# Patient Record
Sex: Male | Born: 1985 | Race: Black or African American | Hispanic: No | Marital: Single | Smoking: Never smoker
Health system: Southern US, Community
[De-identification: ages and names within clinical notes are randomized; demographics above are authoritative.]

## PROBLEM LIST (undated history)

## (undated) DIAGNOSIS — L739 Follicular disorder, unspecified: Secondary | ICD-10-CM

---

## 2011-11-23 ENCOUNTER — Encounter: Payer: Self-pay | Admitting: Emergency Medicine

## 2011-11-23 DIAGNOSIS — L03211 Cellulitis of face: Secondary | ICD-10-CM | POA: Insufficient documentation

## 2011-11-23 DIAGNOSIS — L0201 Cutaneous abscess of face: Secondary | ICD-10-CM | POA: Insufficient documentation

## 2011-11-23 DIAGNOSIS — L738 Other specified follicular disorders: Secondary | ICD-10-CM | POA: Insufficient documentation

## 2011-11-23 NOTE — ED Notes (Signed)
Pt c/o recurrent abscess to chin and right side

## 2011-11-24 ENCOUNTER — Emergency Department (HOSPITAL_BASED_OUTPATIENT_CLINIC_OR_DEPARTMENT_OTHER)
Admission: EM | Admit: 2011-11-24 | Discharge: 2011-11-24 | Disposition: A | Discharge status: Home / Self Care | Payer: PRIVATE HEALTH INSURANCE | Attending: Emergency Medicine | Admitting: Emergency Medicine

## 2011-11-24 DIAGNOSIS — L739 Follicular disorder, unspecified: Secondary | ICD-10-CM

## 2011-11-24 DIAGNOSIS — L0291 Cutaneous abscess, unspecified: Secondary | ICD-10-CM

## 2011-11-24 MED ORDER — IBUPROFEN 600 MG PO TABS: 600.0000 mg | ORAL_TABLET | Freq: Four times a day (QID) | ORAL | Status: AC | PRN

## 2011-11-24 MED ORDER — HYDROCODONE-ACETAMINOPHEN 5-325 MG PO TABS: 1.0000 | ORAL_TABLET | ORAL | Status: AC | PRN

## 2011-11-24 MED ORDER — LIDOCAINE HCL 2 % IJ SOLN
INTRAMUSCULAR | Status: AC
  Filled 2011-11-24: qty 1

## 2011-11-24 MED ORDER — CLINDAMYCIN HCL 150 MG PO CAPS: 450.0000 mg | ORAL_CAPSULE | Freq: Three times a day (TID) | ORAL | Status: AC

## 2011-11-24 MED ORDER — HYDROCODONE-ACETAMINOPHEN 5-325 MG PO TABS
1.0000 | ORAL_TABLET | Freq: Once | ORAL | Status: AC
  Administered 2011-11-24: 1 via ORAL
  Filled 2011-11-24: qty 1

## 2011-11-24 NOTE — ED Provider Notes (Signed)
History     CSN: [id number]  Arrival date & time 11/23/11  2117   First MD Initiated Contact with Patient 11/24/11 0007      Chief Complaint  Patient presents with  . Abscess    (Consider location/radiation/quality/duration/timing/severity/associated sxs/prior treatment) HPI 25yoM ho multiple abscesses presents with concern for abscess. The patient states that for the past 2 days he's experienced an abscess to his left chin. States that the abscess on his chin began to spontaneously drain today. The abscess on his right side has been there for approximately one to 2 weeks and states that he has recurrent abscesses in this area. He denies fever, chills. He denies shortness of breath, neck swelling, tongue swelling. No other complaints today.  History reviewed. No pertinent past medical history.  History reviewed. No pertinent past surgical history.  No family history on file.  History  Substance Use Topics  . Smoking status: Never Smoker   . Smokeless tobacco: Not on file  . Alcohol Use: No    Review of Systems  All other systems reviewed and are negative.   except as noted HPI   Allergies  Review of patient's allergies indicates not on file.  Home Medications   Current Outpatient Rx  Name Route Sig Dispense Refill  . CLINDAMYCIN HCL 150 MG PO CAPS Oral Take 3 capsules (450 mg total) by mouth 3 (three) times daily. 90 capsule 0  . HYDROCODONE-ACETAMINOPHEN 5-325 MG PO TABS Oral Take 1 tablet by mouth every 4 (four) hours as needed for pain. 6 tablet 0  . IBUPROFEN 600 MG PO TABS Oral Take 1 tablet (600 mg total) by mouth every 6 (six) hours as needed for pain. 30 tablet 0    BP 122/68  Pulse 70  Temp(Src) 98.2 F (36.8 C) (Oral)  Resp 18  Wt 160 lb (72.576 kg)  SpO2 100%  Physical Exam  Nursing note and vitals reviewed. Constitutional: He is oriented to person, place, and time. He appears well-developed and well-nourished. No distress.  HENT:  Head:  Atraumatic.  Mouth/Throat: Oropharynx is clear and moist.  Eyes: Conjunctivae are normal. Pupils are equal, round, and reactive to light.  Neck: Neck supple.  Cardiovascular: Normal rate, regular rhythm, normal heart sounds and intact distal pulses.  Exam reveals no gallop and no friction rub.   No murmur heard. Pulmonary/Chest: Effort normal. No respiratory distress. He has no wheezes. He has no rales.  Abdominal: Soft. Bowel sounds are normal. There is no tenderness. There is no rebound and no guarding.  Musculoskeletal: Normal range of motion. He exhibits no edema and no tenderness.  Neurological: He is alert and oriented to person, place, and time.  Skin: Skin is warm and dry.       Left chin with 3 x 2 cm area of induration with central opening. No active drainage. There is no fluctuance. No overlying erythema  Right hip/right lower quadrant with 1 x 2 cm area of induration with central opening. No active drainage. There is no fluctuance. no overlying erythema  Psychiatric: He has a normal mood and affect.    ED Course  Procedures (including critical care time)  Labs Reviewed - No data to display No results found.   1. Abscess   2. Folliculitis     MDM  The patient has 2 skin lesions. There is a chronic appearing infection of his lower abdominal wall. He also has an abscess of his left hand. This likely started as a  folliculitis. There is induration and spontaneously drained earlier today. There is nothing to drain at this time. Patient likely has MRSA given history of recurrent abscesses. Given prescription for clindamycin, Norco, ibuprofen. He will follow up for recheck in the emergency department or to his primary care doctor in 2 days. Given strict precautions for return to the emergency department and, particularly for the left chin abscess.        Forbes Cellar, MD 11/24/11 [phone]

## 2011-12-07 ENCOUNTER — Encounter (HOSPITAL_COMMUNITY): Payer: Self-pay

## 2011-12-07 ENCOUNTER — Emergency Department (INDEPENDENT_AMBULATORY_CARE_PROVIDER_SITE_OTHER)
Admission: EM | Admit: 2011-12-07 | Discharge: 2011-12-07 | Disposition: A | Discharge status: Home / Self Care | Payer: PRIVATE HEALTH INSURANCE | Source: Home / Self Care | Attending: Emergency Medicine | Admitting: Emergency Medicine

## 2011-12-07 DIAGNOSIS — J111 Influenza due to unidentified influenza virus with other respiratory manifestations: Secondary | ICD-10-CM

## 2011-12-07 HISTORY — DX: Follicular disorder, unspecified: L73.9

## 2011-12-07 MED ORDER — OSELTAMIVIR PHOSPHATE 75 MG PO CAPS: 75.0000 mg | ORAL_CAPSULE | Freq: Two times a day (BID) | ORAL | Status: AC

## 2011-12-07 MED ORDER — ACETAMINOPHEN 325 MG PO TABS
975.0000 mg | ORAL_TABLET | Freq: Once | ORAL | Status: AC
  Administered 2011-12-07: 975 mg via ORAL

## 2011-12-07 MED ORDER — ACETAMINOPHEN 325 MG PO TABS
ORAL_TABLET | ORAL | Status: AC
  Filled 2011-12-07: qty 3

## 2011-12-07 MED ORDER — IBUPROFEN 600 MG PO TABS: 600.0000 mg | ORAL_TABLET | Freq: Four times a day (QID) | ORAL | Status: DC | PRN

## 2011-12-07 NOTE — ED Notes (Signed)
C/o fever, body aches, cough and runny nose since yesterday.  States he vomited x 1 yesterday.

## 2011-12-07 NOTE — ED Provider Notes (Signed)
History     CSN: [id number]  Arrival date & time 12/07/11  1356   First MD Initiated Contact with Patient 12/07/11 1444      Chief Complaint  Patient presents with  . Fever  . Generalized Body Aches    HPI Comments: HPI : Flu symptoms for about 1 day. Fever to 103 with chills, sweats, myalgias, fatigue, headache. Symptoms are progressively worsening, despite trying OTC fever reducing medicine and rest and fluids. Has decreased appetite, but tolerating some liquids by mouth. Vomited 1x yesterday, none since. Did not get flu shot this year. Close contact with less severe flu like sx. Currently on clindamycin for folliculitis.   Review of Systems: Positive for fatigue, mild nasal congestion, mild sore throat, mild swollen anterior neck glands, mild cough. Negative for acute vision changes, stiff neck, focal weakness, syncope, seizures, respiratory distress, diarrhea, GU symptoms.   Patient is a 26 y.o. male presenting with fever. The history is provided by the patient.  Fever Primary symptoms of the febrile illness include fever.    Past Medical History  Diagnosis Date  . Folliculitis     History reviewed. No pertinent past surgical history.  History reviewed. No pertinent family history.  History  Substance Use Topics  . Smoking status: Never Smoker   . Smokeless tobacco: Not on file  . Alcohol Use: No      Review of Systems  Constitutional: Positive for fever.    Allergies  Review of patient's allergies indicates no known allergies.  Home Medications   Current Outpatient Rx  Name Route Sig Dispense Refill  . CLINDAMYCIN HCL 150 MG PO CAPS Oral Take 150 mg by mouth 3 (three) times daily.    Marland Kitchen HYDROCODONE-ACETAMINOPHEN 5-325 MG PO TABS Oral Take 1 tablet by mouth every 6 (six) hours as needed.    Marland Kitchen PRESCRIPTION MEDICATION  Antibiotic for abscess    . IBUPROFEN 600 MG PO TABS Oral Take 1 tablet (600 mg total) by mouth every 6 (six) hours as needed for pain or  fever. 30 tablet 0  . OSELTAMIVIR PHOSPHATE 75 MG PO CAPS Oral Take 1 capsule (75 mg total) by mouth 2 (two) times daily. X 5 days 10 capsule 0    BP 107/62  Pulse 137  Temp(Src) 102.3 F (39.1 C) (Oral)  Resp 20  SpO2 97%  Physical Exam  Nursing note and vitals reviewed. Constitutional: He is oriented to person, place, and time. He appears well-developed and well-nourished.  HENT:  Head: Normocephalic and atraumatic.  Right Ear: Hearing, tympanic membrane and ear canal normal.  Left Ear: Hearing, tympanic membrane and ear canal normal.  Nose: Mucosal edema and rhinorrhea present. No epistaxis.  Mouth/Throat: Uvula is midline and mucous membranes are normal. Posterior oropharyngeal erythema present. No oropharyngeal exudate.       No sinus tenderness  Eyes: Conjunctivae and EOM are normal. Pupils are equal, round, and reactive to light.  Neck: Normal range of motion. Neck supple.       Shotty cervical LN bilaterally  Cardiovascular: Normal rate, regular rhythm and normal heart sounds.   Pulmonary/Chest: Effort normal and breath sounds normal. No respiratory distress.  Abdominal: Soft. Bowel sounds are normal. He exhibits no distension. There is no tenderness.  Musculoskeletal: Normal range of motion. He exhibits no edema and no tenderness.  Neurological: He is alert and oriented to person, place, and time.  Skin: Skin is warm and dry. No rash noted.  Psychiatric: He has a normal mood  and affect. His behavior is normal. Judgment and thought content normal.    ED Course  Procedures (including critical care time)  Labs Reviewed - No data to display No results found.   1. Influenza       MDM  Pt appears to be in NAD. VSS. Pt non-toxic appearing. Febrile, tachycardic but appears well hydrated. Given 1 gm tylenol for fever which pt tolerated. No evidence of pharyngitis or OM. No evidence of neck stiffness or other sx to support meningitis. No evidence of dehydration. Abd  S/NT/ND without peritoneal sx. Doubt intraabdominal process. No evidence of PNA or UTI. Pt able to tolerate PO. Pt with flu. Will treat symptomatically and have pt f/u with PCP PRN. Sx <24 hr will rx for tamiflu    Luiz Blare, MD 12/07/11 1600

## 2014-05-02 ENCOUNTER — Ambulatory Visit: Payer: 59

## 2014-05-02 ENCOUNTER — Ambulatory Visit (INDEPENDENT_AMBULATORY_CARE_PROVIDER_SITE_OTHER): Payer: 59 | Admitting: Family Medicine

## 2014-05-02 VITALS — BP 110/80 | HR 68 | Temp 97.9°F | Resp 16 | Ht 68.5 in | Wt 185.0 lb

## 2014-05-02 DIAGNOSIS — R062 Wheezing: Secondary | ICD-10-CM

## 2014-05-02 DIAGNOSIS — R059 Cough, unspecified: Secondary | ICD-10-CM

## 2014-05-02 DIAGNOSIS — R5383 Other fatigue: Secondary | ICD-10-CM

## 2014-05-02 DIAGNOSIS — R05 Cough: Secondary | ICD-10-CM

## 2014-05-02 DIAGNOSIS — R531 Weakness: Secondary | ICD-10-CM

## 2014-05-02 DIAGNOSIS — R5381 Other malaise: Secondary | ICD-10-CM

## 2014-05-02 DIAGNOSIS — R51 Headache: Secondary | ICD-10-CM

## 2014-05-02 LAB — POCT CBC
Granulocyte percent: 50.7 %G (ref 37–80)
HCT, POC: 42.7 % — AB (ref 43.5–53.7)
Hemoglobin: 13.4 g/dL — AB (ref 14.1–18.1)
Lymph, poc: 2.4 (ref 0.6–3.4)
MCH, POC: 25.6 pg — AB (ref 27–31.2)
MCHC: 31.4 g/dL — AB (ref 31.8–35.4)
MCV: 81.4 fL (ref 80–97)
MID (cbc): 0.7 (ref 0–0.9)
MPV: 11.1 fL (ref 0–99.8)
POC Granulocyte: 3.1 (ref 2–6.9)
POC LYMPH PERCENT: 38.2 %L (ref 10–50)
POC MID %: 11.1 %M (ref 0–12)
Platelet Count, POC: 272 10*3/uL (ref 142–424)
RBC: 5.24 M/uL (ref 4.69–6.13)
RDW, POC: 14.3 %
WBC: 6.2 10*3/uL (ref 4.6–10.2)

## 2014-05-02 MED ORDER — AMOXICILLIN 875 MG PO TABS: 875.0000 mg | ORAL_TABLET | Freq: Two times a day (BID) | ORAL | Status: DC

## 2014-05-02 MED ORDER — HYDROCODONE-HOMATROPINE 5-1.5 MG/5ML PO SYRP: 5.0000 mL | ORAL_SOLUTION | Freq: Three times a day (TID) | ORAL | Status: DC | PRN

## 2014-05-02 NOTE — Addendum Note (Signed)
Addended by: Johnnette Litter on: 05/02/2014 06:05 PM   Modules accepted: Orders

## 2014-05-02 NOTE — Progress Notes (Addendum)
Is a 28 year old accompanied by his significant other who comes in with 1 week of cough, headache, and fatigue. He's had some past medical history consistent with asthma but has not been diagnosed with asthma. She's had a fever in the last couple days as well.  He's had no hemoptysis or chest pain. He's also had no shortness of breath.  Objective: No acute distress HEENT: Unremarkable ears and throat Neck: Supple no adenopathy Heart: Regular no matter, no murmur Chest: Few expiratory wheezes, primarily in the left chest Skin: Unremarkable Extremities: Moving easily with no obvious muscle wasting or impairment Results for orders placed in visit on 05/02/14  POCT CBC      Result Value Ref Range   WBC 6.2  4.6 - 10.2 K/uL   Lymph, poc 2.4  0.6 - 3.4   POC LYMPH PERCENT 38.2  10 - 50 %L   MID (cbc) 0.7  0 - 0.9   POC MID % 11.1  0 - 12 %M   POC Granulocyte 3.1  2 - 6.9   Granulocyte percent 50.7  37 - 80 %G   RBC 5.24  4.69 - 6.13 M/uL   Hemoglobin 13.4 (*) 14.1 - 18.1 g/dL   HCT, POC 87.6 (*) 81.1 - 53.7 %   MCV 81.4  80 - 97 fL   MCH, POC 25.6 (*) 27 - 31.2 pg   MCHC 31.4 (*) 31.8 - 35.4 g/dL   RDW, POC 57.2     Platelet Count, POC 272  142 - 424 K/uL   MPV 11.1  0 - 99.8 fL   [facility] reading (PRIMARY) by  Dr. Milus Glazier: CXR. Multiple small pulmonary nodules and some peribronchial cuffing.  Cough - Plan: DG Chest 2 View, POCT CBC, HYDROcodone-homatropine (HYCODAN) 5-1.5 MG/5ML syrup, amoxicillin (AMOXIL) 875 MG tablet, DISCONTINUED: amoxicillin (AMOXIL) 875 MG tablet  Weakness - Plan: POCT CBC  Headache(784.0) - Plan: POCT CBC  Wheezing - Plan: DG Chest 2 View, HYDROcodone-homatropine (HYCODAN) 5-1.5 MG/5ML syrup, amoxicillin (AMOXIL) 875 MG tablet, DISCONTINUED: amoxicillin (AMOXIL) 875 MG tablet  Signed, Elvina Sidle, MD

## 2014-05-02 NOTE — Patient Instructions (Signed)

## 2015-04-18 ENCOUNTER — Ambulatory Visit: Payer: Worker's Compensation

## 2015-04-18 ENCOUNTER — Ambulatory Visit (INDEPENDENT_AMBULATORY_CARE_PROVIDER_SITE_OTHER): Payer: Worker's Compensation | Admitting: Family Medicine

## 2015-04-18 VITALS — BP 112/68 | HR 63 | Temp 98.6°F | Resp 16 | Ht 67.5 in | Wt 166.4 lb

## 2015-04-18 DIAGNOSIS — M25532 Pain in left wrist: Secondary | ICD-10-CM | POA: Diagnosis not present

## 2015-04-18 DIAGNOSIS — S6992XA Unspecified injury of left wrist, hand and finger(s), initial encounter: Secondary | ICD-10-CM | POA: Diagnosis not present

## 2015-04-18 MED ORDER — NAPROXEN 500 MG PO TABS: 500.0000 mg | ORAL_TABLET | Freq: Two times a day (BID) | ORAL | Status: AC

## 2015-04-18 NOTE — Patient Instructions (Signed)
Wear wrist splint  Naproxen 500 mg twice daily  Apply ice for about 15 minutes for 5 times daily for the next couple of days  Avoid strenuous work

## 2015-04-18 NOTE — Progress Notes (Signed)
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 He was lifting a case of [employer] off of the shelf, and something shifted underneath it. When it fell he tried to catch it and injured his left wrist. That was earlier this afternoon, and it is continued to hurt him. He does not have any history of prior problems with that wrist.  Objective: Tender in the joint space of the left wrist. The bones do not seem that tender. Flexion extension and lateral flexion and extension of the wrist hurt. He has a good grip and normal sensation. Vascular intact.  Assessment: Left wrist pain and strain secondary to injury  Plan: X-ray left wrist  [facility] reading (PRIMARY) by  Dr. Alwyn RenHopper Normal wrist x-ray  Treat with splint and naproxen. Return in 4 days for recheck.

## 2015-04-22 ENCOUNTER — Ambulatory Visit (INDEPENDENT_AMBULATORY_CARE_PROVIDER_SITE_OTHER): Payer: Worker's Compensation | Admitting: Family Medicine

## 2015-04-22 VITALS — BP 112/80 | HR 73 | Temp 99.3°F | Resp 16 | Ht 67.5 in | Wt 172.2 lb

## 2015-04-22 DIAGNOSIS — S63502D Unspecified sprain of left wrist, subsequent encounter: Secondary | ICD-10-CM

## 2015-04-22 DIAGNOSIS — M25532 Pain in left wrist: Secondary | ICD-10-CM

## 2015-04-22 NOTE — Progress Notes (Signed)
  Subjective:  Patient ID: Michael Flores, male    DOB: 1986-07-11  Age: 29 y.o. MRN: [id number]  Patient is here for follow-up of the wrist injury. It still hurts when he flexes and extends the wrist, but is feeling much better. He feels like he can do his job fine as long as he wears the brace. Does not brace is on.   Objective:   No swelling or deformity. He is tender on flexion extension of the wrist, not on side to side tilt. Finger strength is good and station normal  Assessment & Plan:   Assessment: Strain and pain left wrist  Plan: Patient Instructions  Wear splint  Return in one week    Evalynne Locurto, MD 04/22/2015

## 2015-04-22 NOTE — Patient Instructions (Signed)
Wear splint  Return in one week

## 2015-04-29 ENCOUNTER — Ambulatory Visit (INDEPENDENT_AMBULATORY_CARE_PROVIDER_SITE_OTHER): Payer: Worker's Compensation | Admitting: Family Medicine

## 2015-04-29 ENCOUNTER — Ambulatory Visit: Payer: Worker's Compensation

## 2015-04-29 VITALS — BP 110/80 | HR 86 | Temp 98.8°F | Resp 18 | Ht 67.5 in | Wt 167.0 lb

## 2015-04-29 DIAGNOSIS — M25532 Pain in left wrist: Secondary | ICD-10-CM | POA: Diagnosis not present

## 2015-04-29 NOTE — Patient Instructions (Addendum)
Return in 2 weeks for a follow-up check (I will be here the 18th and 20th)  Continue using the splint  Referral is being made to hand physical therapy for therapy on the wrist to strengthen it

## 2015-04-29 NOTE — Progress Notes (Addendum)
  Subjective:  Patient ID: Michael Flores, male    DOB: [birth year]  Age: 29 y.o. MRN: [id number]  Continues to have a lot of pain in the left wrist with flexion or extension. As long as he keeps the splint on he does okay and has been able to work with it on.   Objective:   Pain in wrist with flexion and extension, not side to side tilt. It is tender right in the joint space area.  Due to the persistence of the pain will go ahead and get a repeat x-ray.  [facility] reading (PRIMARY) by  Dr. Alwyn RenHopper Normal wrist  Assessment & Plan:   Assessment: Pain and sprain left wrist, persistent  Plan: Patient Instructions  Return in 2 weeks for a follow-up check (I will be here the 18th and 20th)  Continue using the splint  Referral is being made to hand physical therapy for therapy on the wrist to strengthen it     Shalah Estelle, MD 04/29/2015

## 2015-05-09 ENCOUNTER — Telehealth: Payer: Self-pay

## 2015-05-09 NOTE — Telephone Encounter (Signed)
Please write Rx for occupational therapy. Fax to [facility] at [phone]

## 2015-05-10 ENCOUNTER — Other Ambulatory Visit: Payer: Self-pay | Admitting: Physician Assistant

## 2015-05-10 DIAGNOSIS — M25532 Pain in left wrist: Secondary | ICD-10-CM

## 2015-05-10 DIAGNOSIS — S6992XA Unspecified injury of left wrist, hand and finger(s), initial encounter: Secondary | ICD-10-CM

## 2015-05-10 NOTE — Telephone Encounter (Signed)
Dr. Alwyn Ren put in referral to PT on 04/29/15 -- who is requesting referral to OT instead?

## 2015-05-10 NOTE — Progress Notes (Signed)
[employer] approved for 12 OT visits.  He is currently at the occupational appointment, and this is sent urgently to begin therapeutic treatment.

## 2015-05-11 NOTE — Telephone Encounter (Signed)
OT referral placed for change.  Faxed sent to OT (PT) facility for his appointment (he was waiting at the facility).  Per my referral note,  He has the occupational therapist. This is a worker's comp, orders only. No need for initial scheduling.  -I will close this now

## 2015-05-11 NOTE — Telephone Encounter (Signed)
Michael Flores states it is for her wrist but it may be for PT. I just thought I heard her say occupational.

## 2015-07-29 ENCOUNTER — Ambulatory Visit (INDEPENDENT_AMBULATORY_CARE_PROVIDER_SITE_OTHER): Payer: Worker's Compensation | Admitting: Emergency Medicine

## 2015-07-29 VITALS — BP 108/78 | HR 55 | Temp 98.1°F | Resp 16 | Ht 67.5 in | Wt 162.0 lb

## 2015-07-29 DIAGNOSIS — M25532 Pain in left wrist: Secondary | ICD-10-CM

## 2015-07-29 NOTE — Progress Notes (Signed)
   Subjective:  Patient ID: Michael Flores, male    DOB: October 24, 1986  Age: 29 y.o. MRN: [id number]  CC: Follow-up   HPI Hazim Treadway presents  for release to work. He was injured on May 23 at work and suffered wrist injury he is returned to work full time and  administratively needs a return to work authorization. He currently has no pain in his wrist. Works full-time with no consequence has no limitation movement or use of his wrist takes no medication  History  Past medical family and social history unremarkable  Review of Systems   Review of systems noncontributory  Objective:  BP 108/78 mmHg  Pulse 55  Temp(Src) 98.1 F (36.7 C) (Oral)  Resp 16  Ht 5' 7.5" (1.715 m)  Wt 162 lb (73.483 kg)  BMI 24.98 kg/m2  SpO2 98%  Physical Exam  Constitutional: He is oriented to person, place, and time. He appears well-developed and well-nourished.  HENT:  Head: Normocephalic and atraumatic.  Eyes: Conjunctivae are normal. Pupils are equal, round, and reactive to light.  Pulmonary/Chest: Effort normal.  Musculoskeletal: He exhibits no edema.  Neurological: He is alert and oriented to person, place, and time.  Skin: Skin is dry.  Psychiatric: He has a normal mood and affect. His behavior is normal. Thought content normal.      Assessment & Plan:   Johnedward was seen today for follow-up.  Diagnoses and all orders for this visit:  Wrist pain, left   I am having Mr. Lorenzen maintain his naproxen.  No orders of the defined types were placed in this encounter.    He was released to go back to work full time with no follow-up  Appropriate red flag conditions were discussed with the patient as well as actions that should be taken.  Patient expressed his understanding.  Follow-up: Return if symptoms worsen or fail to improve.  Carmelina Dane, MD

## 2015-07-29 NOTE — Patient Instructions (Signed)

## 2015-12-03 IMAGING — CR DG WRIST COMPLETE 3+V*L*
4 series · 4 of 4 positions shown · non-contrast
Comparison: None.

CLINICAL DATA: Acute left wrist pain after injury at work today.
Initial encounter.

EXAM:
LEFT WRIST - COMPLETE 3+ VIEW

[PA]
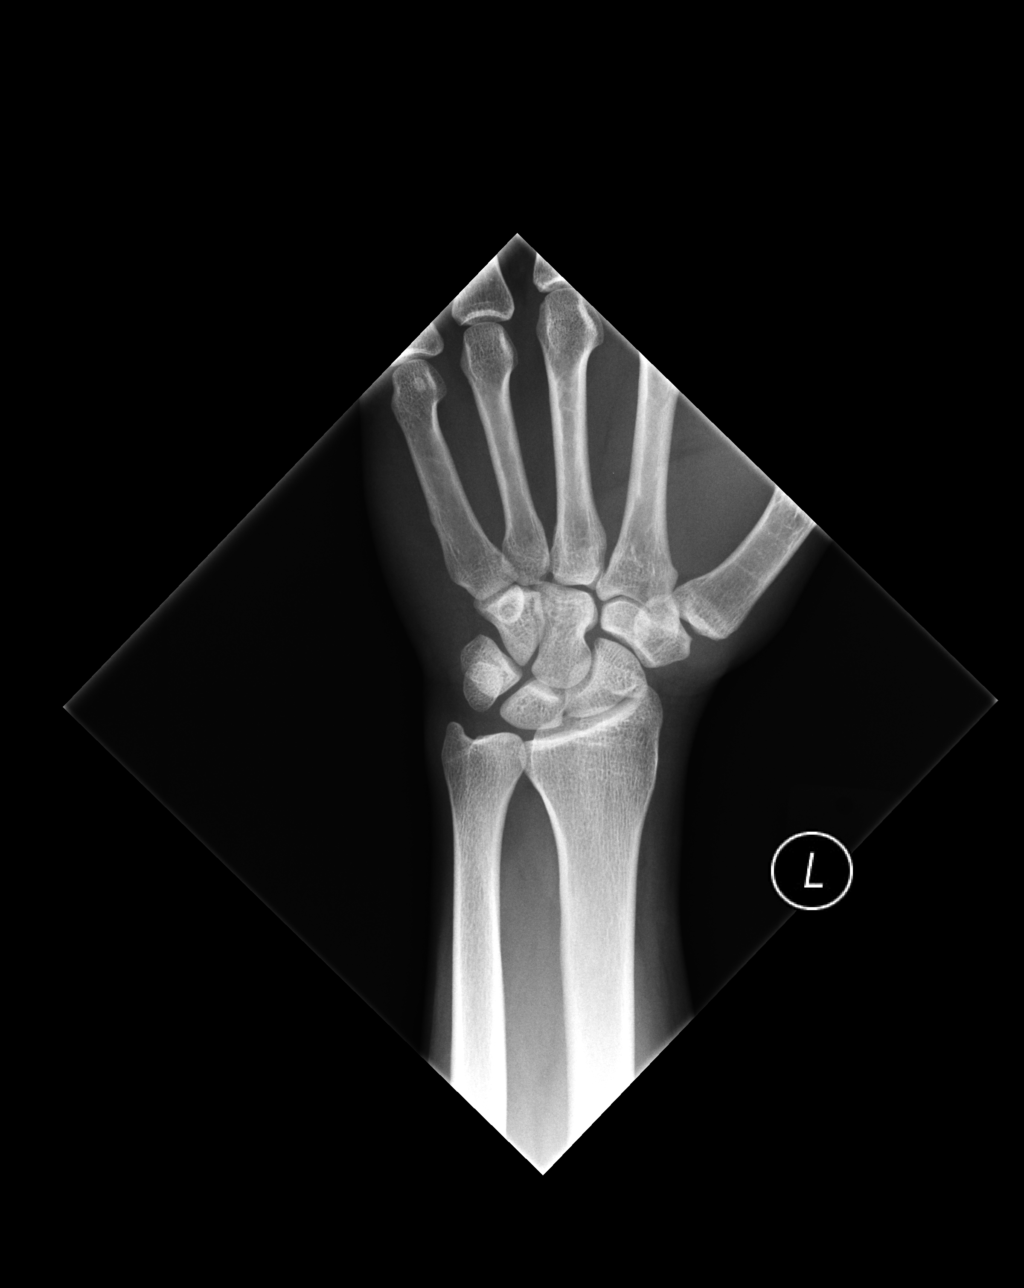

[pa int rot]
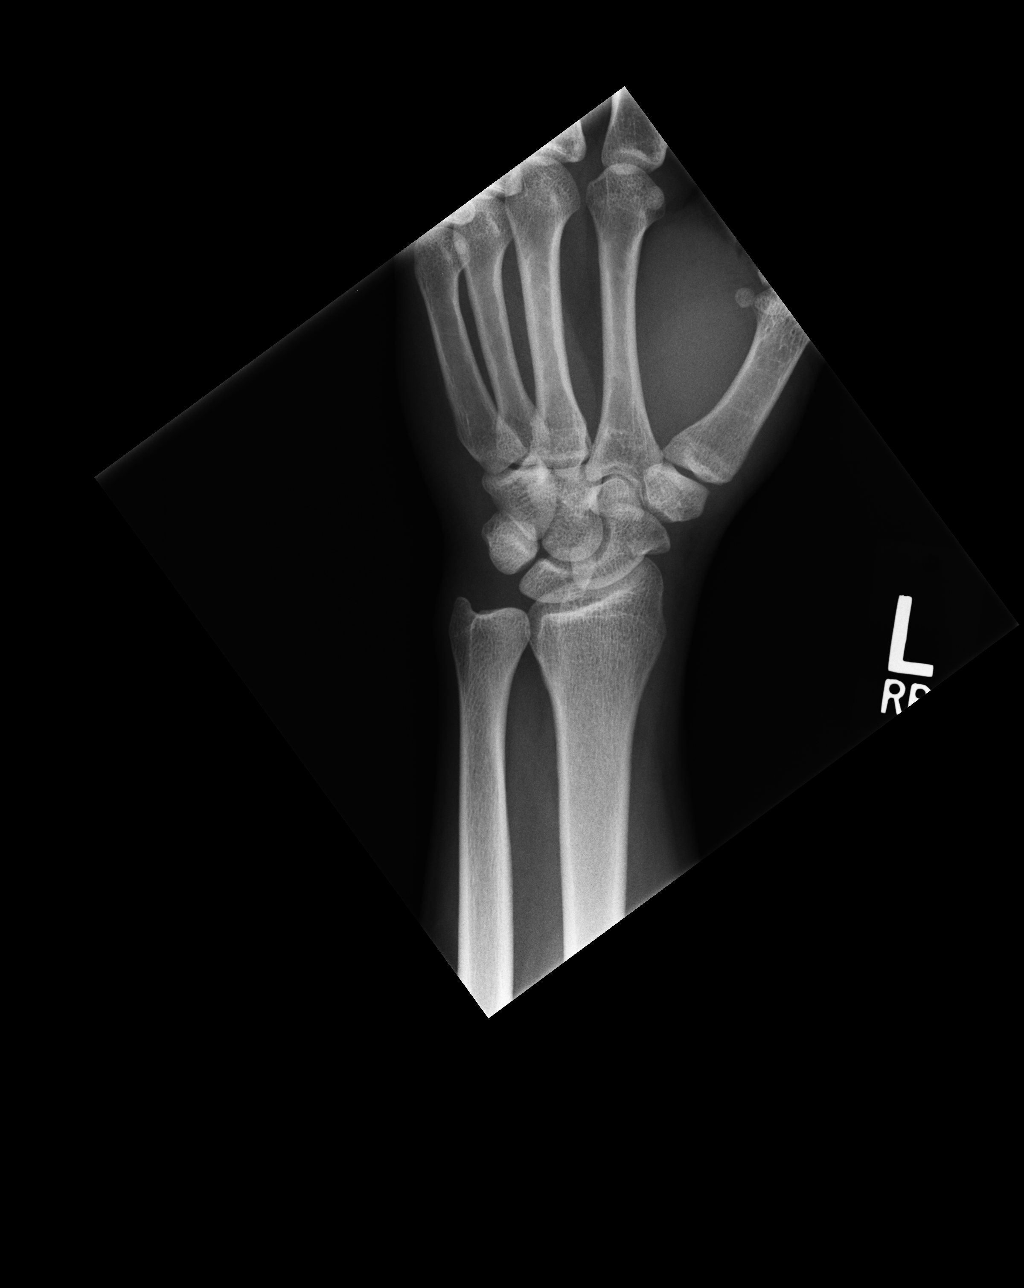

[lateral]
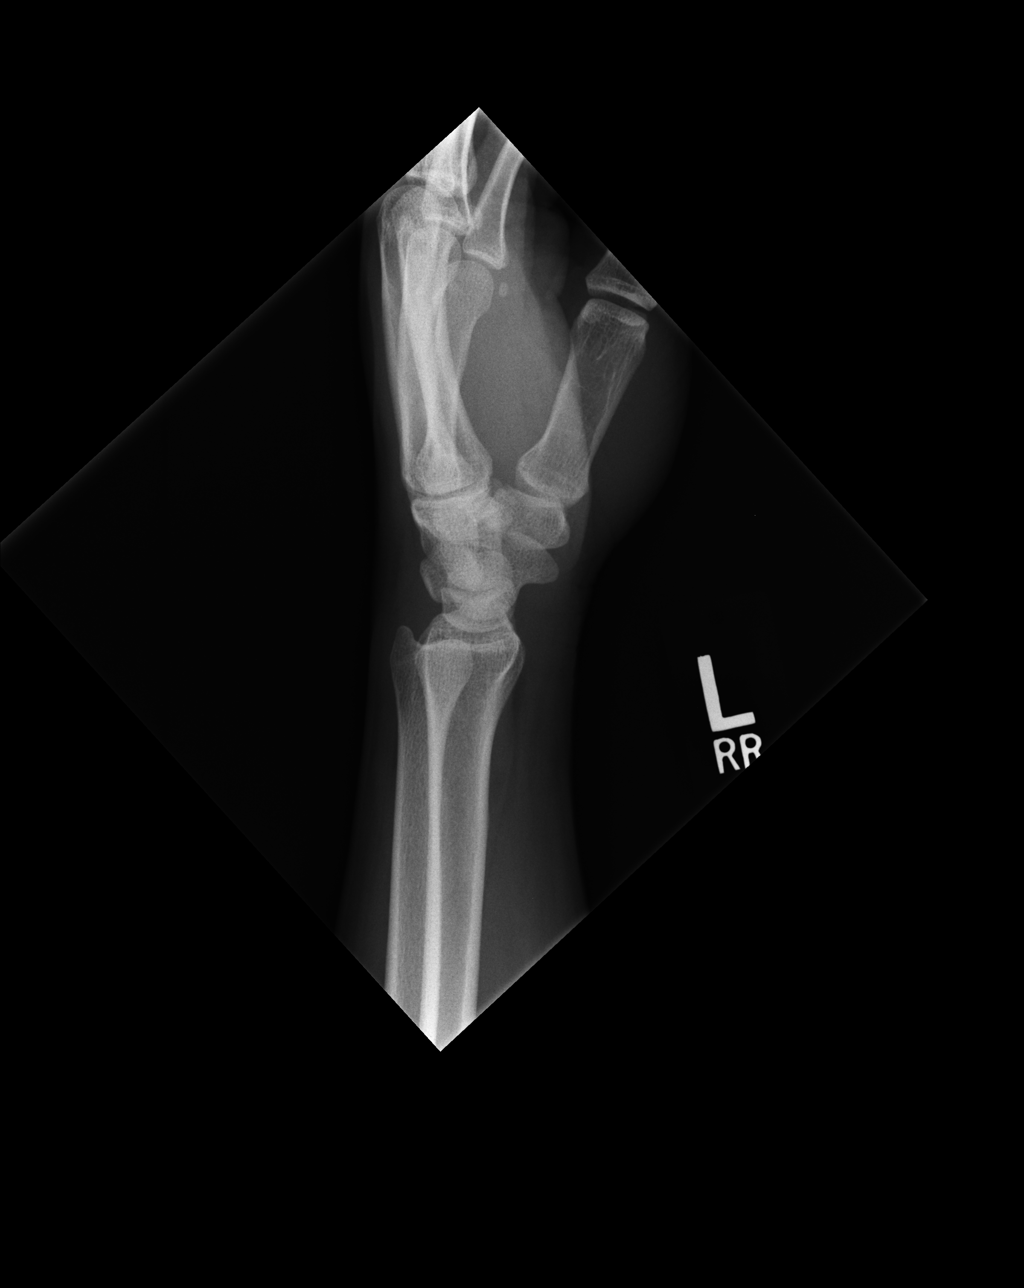

[ap ext rot]
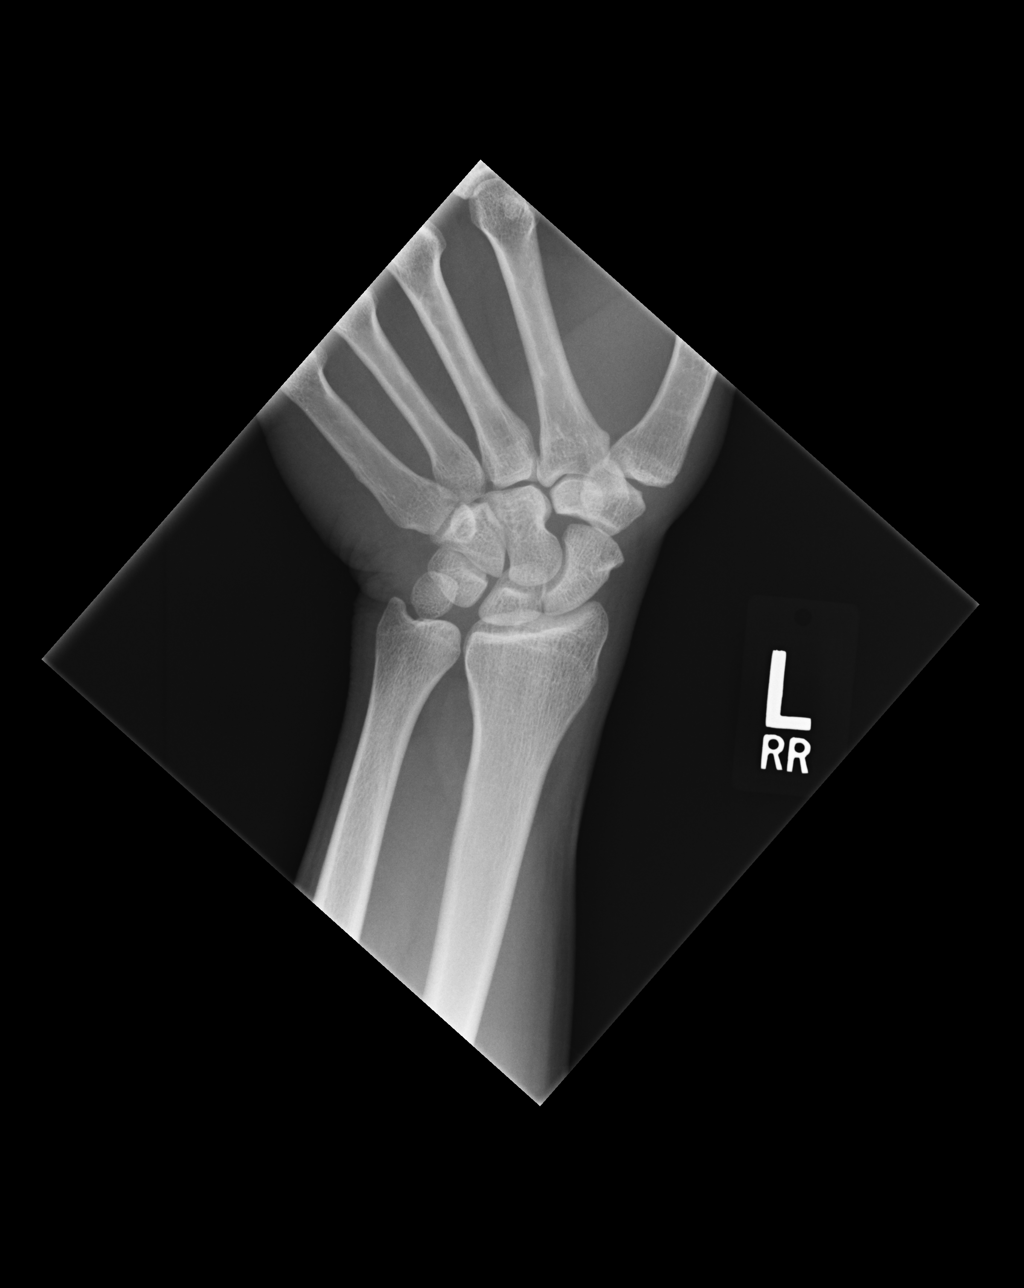

[4 of 4 positions shown; findings below may reference images not displayed]

FINDINGS: There is no evidence of fracture or dislocation. There is no
evidence of arthropathy or other focal bone abnormality. Soft
tissues are unremarkable.
IMPRESSION: Normal left wrist.

## 2017-07-30 ENCOUNTER — Encounter (HOSPITAL_COMMUNITY): Payer: Self-pay | Admitting: *Deleted

## 2017-07-30 ENCOUNTER — Ambulatory Visit (HOSPITAL_COMMUNITY)
Admission: EM | Admit: 2017-07-30 | Discharge: 2017-07-30 | Disposition: A | Discharge status: Home / Self Care | Payer: Managed Care, Other (non HMO) | Attending: Family Medicine | Admitting: Family Medicine

## 2017-07-30 DIAGNOSIS — B349 Viral infection, unspecified: Secondary | ICD-10-CM

## 2017-07-30 MED ORDER — DICLOFENAC SODIUM 75 MG PO TBEC: 75.0000 mg | DELAYED_RELEASE_TABLET | Freq: Two times a day (BID) | ORAL | 0 refills | Status: AC

## 2017-07-30 NOTE — ED Triage Notes (Addendum)
Pt  Reports body Aches   And fheadache   With  Chills   As   Well    Symptoms     X   sev  Days       Pt  Reports       Symptoms   For  Several    Days

## 2017-07-31 NOTE — ED Provider Notes (Signed)
  [facility]   [id number] 07/30/17 Arrival Time: 1848  ASSESSMENT & PLAN:  1. Viral syndrome     Meds ordered this encounter  Medications  . diclofenac (VOLTAREN) 75 MG EC tablet    Sig: Take 1 tablet (75 mg total) by mouth 2 (two) times daily.    Dispense:  14 tablet    Refill:  0    Ensure adequate fluid intake and rest. OTC analgesics and symptom care as needed. Observation. Will return if not improving over the next 48-72 hours, sooner if needed.  Reviewed expectations re: course of current medical issues. Questions answered. Outlined signs and symptoms indicating need for more acute intervention. Patient verbalized understanding. After Visit Summary given.   SUBJECTIVE:  Michael Flores is a 31 y.o. male who reports body aches and chills and fatigue for the past few days. Relatively acute onset. Unsure of fever. No rashes. No n/v/d. Mild congestion. No respiratory symptoms. Occasional Tylenol without much help. No recent travel or known sick contacts.    ROS: As per HPI.   OBJECTIVE:  Vitals:   07/30/17 1942  BP: 110/72  Pulse: 88  Resp: 18  Temp: 99.2 F (37.3 C)  TempSrc: Oral  SpO2: 100%    General appearance: alert; no distress HEENT: nasal congestion Neck: supple without LAD Lungs: clear to auscultation bilaterally Abd: soft and nontender Skin: warm and dry Psychological: alert and cooperative; normal mood and affect  No Known Allergies  Past Medical History:  Diagnosis Date  . Folliculitis            Mardella LaymanHagler, Yaseen Gilberg, MD 07/31/17 [phone]

## 2022-05-24 ENCOUNTER — Other Ambulatory Visit (HOSPITAL_BASED_OUTPATIENT_CLINIC_OR_DEPARTMENT_OTHER): Payer: Self-pay

## 2022-05-24 DIAGNOSIS — E663 Overweight: Secondary | ICD-10-CM

## 2022-05-24 DIAGNOSIS — R0683 Snoring: Secondary | ICD-10-CM
